# Patient Record
Sex: Male | Born: 2005 | Race: Black or African American | Hispanic: No | Marital: Single
Health system: Southern US, Community
[De-identification: ages and names within clinical notes are randomized; demographics above are authoritative.]

---
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
## 2024-03-31 ENCOUNTER — Other Ambulatory Visit (HOSPITAL_COMMUNITY): Payer: Self-pay
